# Patient Record
Sex: Male | Born: 1935 | Race: White | Hispanic: No | Marital: Married | Smoking: Never smoker
Health system: Southern US, Community
[De-identification: ages and names within clinical notes are randomized; demographics above are authoritative.]

## PROBLEM LIST (undated history)

## (undated) DIAGNOSIS — I219 Acute myocardial infarction, unspecified: Secondary | ICD-10-CM

## (undated) DIAGNOSIS — H3530 Unspecified macular degeneration: Secondary | ICD-10-CM

## (undated) DIAGNOSIS — H409 Unspecified glaucoma: Secondary | ICD-10-CM

## (undated) DIAGNOSIS — I10 Essential (primary) hypertension: Secondary | ICD-10-CM

## (undated) DIAGNOSIS — N289 Disorder of kidney and ureter, unspecified: Secondary | ICD-10-CM

## (undated) DIAGNOSIS — E119 Type 2 diabetes mellitus without complications: Secondary | ICD-10-CM

## (undated) DIAGNOSIS — C801 Malignant (primary) neoplasm, unspecified: Secondary | ICD-10-CM

## (undated) HISTORY — PX: CARDIAC SURGERY: SHX584

## (undated) HISTORY — PX: EYE SURGERY: SHX253

## (undated) HISTORY — PX: BACK SURGERY: SHX140

## (undated) HISTORY — PX: PROSTATE SURGERY: SHX751

## (undated) HISTORY — PX: HERNIA REPAIR: SHX51

---

## 2012-01-09 DIAGNOSIS — I2510 Atherosclerotic heart disease of native coronary artery without angina pectoris: Secondary | ICD-10-CM

## 2012-02-22 ENCOUNTER — Ambulatory Visit (INDEPENDENT_AMBULATORY_CARE_PROVIDER_SITE_OTHER): Payer: Self-pay | Admitting: Thoracic Surgery (Cardiothoracic Vascular Surgery)

## 2012-02-22 DIAGNOSIS — Z951 Presence of aortocoronary bypass graft: Secondary | ICD-10-CM

## 2012-02-22 DIAGNOSIS — I2510 Atherosclerotic heart disease of native coronary artery without angina pectoris: Secondary | ICD-10-CM

## 2012-02-22 NOTE — Progress Notes (Unsigned)
The patient is doing well. His sternum is stable. Wounds are well healed. He is walking for exercise. He has seen his cardiologist and they are pleased with his progress. Sternal precautions were reviewed. He will continue follow up with cardiology ad see [place] prn.

## 2014-08-17 DIAGNOSIS — J301 Allergic rhinitis due to pollen: Secondary | ICD-10-CM | POA: Insufficient documentation

## 2014-08-17 DIAGNOSIS — C61 Malignant neoplasm of prostate: Secondary | ICD-10-CM | POA: Insufficient documentation

## 2014-08-18 DIAGNOSIS — N184 Chronic kidney disease, stage 4 (severe): Secondary | ICD-10-CM | POA: Insufficient documentation

## 2014-08-18 DIAGNOSIS — N529 Male erectile dysfunction, unspecified: Secondary | ICD-10-CM | POA: Insufficient documentation

## 2014-08-18 DIAGNOSIS — E7800 Pure hypercholesterolemia, unspecified: Secondary | ICD-10-CM | POA: Insufficient documentation

## 2014-08-18 DIAGNOSIS — I701 Atherosclerosis of renal artery: Secondary | ICD-10-CM | POA: Insufficient documentation

## 2014-08-18 DIAGNOSIS — I10 Essential (primary) hypertension: Secondary | ICD-10-CM | POA: Insufficient documentation

## 2015-06-30 DIAGNOSIS — Z951 Presence of aortocoronary bypass graft: Secondary | ICD-10-CM | POA: Insufficient documentation

## 2015-12-08 DIAGNOSIS — E1129 Type 2 diabetes mellitus with other diabetic kidney complication: Secondary | ICD-10-CM | POA: Insufficient documentation

## 2016-12-27 DIAGNOSIS — I779 Disorder of arteries and arterioles, unspecified: Secondary | ICD-10-CM | POA: Insufficient documentation

## 2016-12-27 DIAGNOSIS — I739 Peripheral vascular disease, unspecified: Secondary | ICD-10-CM

## 2018-02-10 DIAGNOSIS — E039 Hypothyroidism, unspecified: Secondary | ICD-10-CM | POA: Insufficient documentation

## 2018-08-24 DIAGNOSIS — Z7982 Long term (current) use of aspirin: Secondary | ICD-10-CM | POA: Insufficient documentation

## 2018-10-03 DIAGNOSIS — D638 Anemia in other chronic diseases classified elsewhere: Secondary | ICD-10-CM | POA: Insufficient documentation

## 2019-02-05 ENCOUNTER — Emergency Department (HOSPITAL_BASED_OUTPATIENT_CLINIC_OR_DEPARTMENT_OTHER): Payer: Medicare HMO

## 2019-02-05 ENCOUNTER — Emergency Department (HOSPITAL_BASED_OUTPATIENT_CLINIC_OR_DEPARTMENT_OTHER)
Admission: EM | Admit: 2019-02-05 | Discharge: 2019-02-05 | Disposition: A | Discharge status: Home / Self Care | Payer: Medicare HMO | Attending: Emergency Medicine | Admitting: Emergency Medicine

## 2019-02-05 ENCOUNTER — Encounter (HOSPITAL_BASED_OUTPATIENT_CLINIC_OR_DEPARTMENT_OTHER): Payer: Self-pay | Admitting: *Deleted

## 2019-02-05 ENCOUNTER — Other Ambulatory Visit: Payer: Self-pay

## 2019-02-05 DIAGNOSIS — W1839XA Other fall on same level, initial encounter: Secondary | ICD-10-CM | POA: Diagnosis not present

## 2019-02-05 DIAGNOSIS — Y9389 Activity, other specified: Secondary | ICD-10-CM | POA: Insufficient documentation

## 2019-02-05 DIAGNOSIS — M545 Low back pain: Secondary | ICD-10-CM | POA: Diagnosis not present

## 2019-02-05 DIAGNOSIS — W19XXXA Unspecified fall, initial encounter: Secondary | ICD-10-CM

## 2019-02-05 DIAGNOSIS — I252 Old myocardial infarction: Secondary | ICD-10-CM | POA: Diagnosis not present

## 2019-02-05 DIAGNOSIS — Y999 Unspecified external cause status: Secondary | ICD-10-CM | POA: Diagnosis not present

## 2019-02-05 DIAGNOSIS — I2510 Atherosclerotic heart disease of native coronary artery without angina pectoris: Secondary | ICD-10-CM | POA: Insufficient documentation

## 2019-02-05 DIAGNOSIS — Y929 Unspecified place or not applicable: Secondary | ICD-10-CM | POA: Diagnosis not present

## 2019-02-05 DIAGNOSIS — E119 Type 2 diabetes mellitus without complications: Secondary | ICD-10-CM | POA: Insufficient documentation

## 2019-02-05 DIAGNOSIS — S50311A Abrasion of right elbow, initial encounter: Secondary | ICD-10-CM | POA: Insufficient documentation

## 2019-02-05 DIAGNOSIS — S4991XA Unspecified injury of right shoulder and upper arm, initial encounter: Secondary | ICD-10-CM | POA: Diagnosis present

## 2019-02-05 DIAGNOSIS — R51 Headache: Secondary | ICD-10-CM | POA: Insufficient documentation

## 2019-02-05 DIAGNOSIS — S42017A Nondisplaced fracture of sternal end of right clavicle, initial encounter for closed fracture: Secondary | ICD-10-CM | POA: Diagnosis not present

## 2019-02-05 DIAGNOSIS — M25551 Pain in right hip: Secondary | ICD-10-CM | POA: Diagnosis not present

## 2019-02-05 DIAGNOSIS — E039 Hypothyroidism, unspecified: Secondary | ICD-10-CM | POA: Diagnosis not present

## 2019-02-05 DIAGNOSIS — I10 Essential (primary) hypertension: Secondary | ICD-10-CM | POA: Diagnosis not present

## 2019-02-05 HISTORY — DX: Type 2 diabetes mellitus without complications: E11.9

## 2019-02-05 HISTORY — DX: Essential (primary) hypertension: I10

## 2019-02-05 HISTORY — DX: Unspecified macular degeneration: H35.30

## 2019-02-05 HISTORY — DX: Malignant (primary) neoplasm, unspecified: C80.1

## 2019-02-05 HISTORY — DX: Unspecified glaucoma: H40.9

## 2019-02-05 HISTORY — DX: Acute myocardial infarction, unspecified: I21.9

## 2019-02-05 HISTORY — DX: Disorder of kidney and ureter, unspecified: N28.9

## 2019-02-05 MED ORDER — HYDROCODONE-ACETAMINOPHEN 5-325 MG PO TABS: 1.0000 | ORAL_TABLET | Freq: Four times a day (QID) | ORAL | 0 refills | Status: AC | PRN

## 2019-02-05 NOTE — ED Triage Notes (Signed)
Pt fell yesterday on a handicap ramp, Pt hit his head and right side of body. Neck, shoulder, and also back

## 2019-02-05 NOTE — ED Notes (Signed)
NAD at this time. Pt is stable and going home.  

## 2019-02-05 NOTE — Discharge Instructions (Addendum)
Please read and follow all provided instructions.  You have been seen today for a fall Your imaging showed that you have a fracture to your right clavicle- we have placed you in a sling for this, please keep this on at all times until you have followed up with sports medicine- please see Dr. Bonnee Quin information in your discharge instructions- call for an appointment within the next 3 days. Your imaging also showed some degenerative changes in your neck, but no fractures.    Home care instructions: -- *PRICE in the first 24-48 hours after injury: Protect (with brace, splint, sling), if given by your provider Rest Ice- Do not apply ice pack directly to your skin, place towel or similar between your skin and ice/ice pack. Apply ice for 20 min, then remove for 40 min while awake Compression- Wear brace, elastic bandage, splint as directed by your provider Elevate affected extremity above the level of your heart when not walking around for the first 24-48 hours   Medications:  - Norco-this is a narcotic/controlled substance medication that has potential addicting qualities.  We recommend that you take 1 tablets every 6 hours as needed for severe pain.  Do not drive or operate heavy machinery when taking this medicine as it can be sedating. Do not drink alcohol or take other sedating medications when taking this medicine for safety reasons.  Keep this out of reach of small children.  Please be aware this medicine has Tylenol in it (325 mg/tab) do not exceed the maximum dose of Tylenol in a day per over the counter recommendations should you decide to supplement with Tylenol over the counter.   We also recommend using lidocaine patches (over the counter patch to place on the area that is most painful) per over the counter dosing  We have prescribed you new medication(s) today. Discuss the medications prescribed today with your pharmacist as they can have adverse effects and interactions with your other  medicines including over the counter and prescribed medications. Seek medical evaluation if you start to experience new or abnormal symptoms after taking one of these medicines, seek care immediately if you start to experience difficulty breathing, feeling of your throat closing, facial swelling, or rash as these could be indications of a more serious allergic reaction   Follow-up instructions: Please follow-up with Sports medicines within 3 days.   Return instructions:  Please return if your digits or extremity are numb or tingling, appear gray or blue, or you have severe pain (also elevate the extremity and loosen splint or wrap if you were given one) Please return if you have redness or fevers.  Please return to the Emergency Department if you experience worsening symptoms.  Please return if you have any other emergent concerns. Additional Information:  Your vital signs today were: BP (!) 176/71 (BP Location: Left Arm)    Pulse 64    Temp 98 F (36.7 C) (Oral)    Resp 18    Ht 5\' 8"  (1.727 m)    Wt 64 kg    SpO2 97%    BMI 21.44 kg/m  If your blood pressure (BP) was elevated above 135/85 this visit, please have this repeated by your doctor within one month. ---------------

## 2019-02-05 NOTE — ED Provider Notes (Signed)
[facility] EMERGENCY DEPARTMENT Provider Note   CSN: [id number] Arrival date & time: 02/05/19  3557    History   Chief Complaint Chief Complaint  Patient presents with  . Fall    HPI Douglas Cain is a 85 y.o. male with a hx of HTN, DM, CAD, hypercholesterolemia, and hypothyroidism who presents to the ED s/p mechanical fall yesterday with complaints of R shoulder/neck pain. Patient states he was ambulating up a handicap ramp, miss-stepped, and fell forward onto this R shoulder. He denies prodromal dizziness, lightheadedness, chest pain, or dyspnea. He states he fell onto the anterior R shoulder & did strike his head on the house siding, no LOC. He was able to get up on his own and has been ambulatory since. He relays primary area of pain is the R shoulder radiating into the R neck. He states pain is constant, worse with movement. He is also having some discomfort to the R side of the head & R lower back/hip area. States he scraped the R elbow, last tetanus was in October 2019. Denies change in vision, numbness, weakness, dizziness, nausea, vomiting, hemoptysis, hematuria, or hematochezia/melena. Patient is on plavix, no other blood thinners.      HPI  Past Medical History:  Diagnosis Date  . Cancer ([place])   . Diabetes mellitus without complication ([place])   . Glaucoma   . Hypertension   . Kidney disease   . Macular degeneration   . Myocardial infarct ([place])     There are no active problems to display for this patient.   Past Surgical History:  Procedure Laterality Date  . BACK SURGERY    . CARDIAC SURGERY    . EYE SURGERY    . HERNIA REPAIR    . PROSTATE SURGERY          Home Medications    Prior to Admission medications   Not on File    Family History History reviewed. No pertinent family history.  Social History Social History   Tobacco Use  . Smoking status: Never Smoker  . Smokeless tobacco: Never Used  Substance Use Topics  . Alcohol  use: Never    Frequency: Never  . Drug use: Never     Allergies   Tape   Review of Systems Review of Systems  Constitutional: Negative for chills and fever.  Eyes: Negative for visual disturbance.  Respiratory: Negative for shortness of breath.   Cardiovascular: Negative for chest pain.  Gastrointestinal: Negative for abdominal pain, nausea and vomiting.  Musculoskeletal: Positive for arthralgias, back pain and neck pain.  Skin: Positive for wound.  Neurological: Positive for headaches. Negative for dizziness, seizures, syncope, facial asymmetry, speech difficulty, weakness, light-headedness and numbness.  All other systems reviewed and are negative.    Physical Exam Updated Vital Signs BP (!) 176/71 (BP Location: Left Arm)   Pulse 64   Temp 98 F (36.7 C) (Oral)   Resp 18   Ht 5\' 8"  (1.727 m)   Wt 64 kg   SpO2 97%   BMI 21.44 kg/m   Physical Exam Vitals signs and nursing note reviewed.  Constitutional:      General: He is not in acute distress.    Appearance: He is well-developed. He is not toxic-appearing.  HENT:     Head: Normocephalic and atraumatic. No raccoon eyes or Battle's sign.     Right Ear: No hemotympanum.     Left Ear: No hemotympanum.     Nose: No rhinorrhea.  Mouth/Throat:     Pharynx: Uvula midline.  Eyes:     General:        Right eye: No discharge.        Left eye: No discharge.     Extraocular Movements: Extraocular movements intact.     Conjunctiva/sclera: Conjunctivae normal.     Comments: PERRL   Neck:     Musculoskeletal: Neck supple. Pain with movement and muscular tenderness (R) present. No edema, erythema, neck rigidity, crepitus or spinous process tenderness.  Cardiovascular:     Rate and Rhythm: Normal rate and regular rhythm.     Comments: 2+ symmetric radial pulses Pulmonary:     Effort: Pulmonary effort is normal. No respiratory distress.     Breath sounds: Normal breath sounds. No wheezing, rhonchi or rales.  Chest:      Chest wall: Tenderness (right anterior chest wall just below the clavicle) present. No mass, lacerations, deformity, swelling, crepitus or edema.     Comments: No bruising to chest/abdomen.  Abdominal:     General: There is no distension.     Palpations: Abdomen is soft.     Tenderness: There is no abdominal tenderness.  Musculoskeletal:     Comments: Upper extremities: Superficial abrasion noted to R posterior elbow- no active bleeding, no surrounding erythema, no purulence. Intact AROM to L shoulder, bilateral elbows, wrists, & all digits. R shoulder flexion/abduction limited secondary to pain- able to move to about 60 degrees with these movements. Patient is tender over the R trapezius, SITS muscles, pec major, and diffuse glenohumeral joint. UEs are otherwise nontender Back: No midline tenderness to the cervical, thoracic, or lumbar spine. No palpable step off. Some R lumbar paraspinal muscle tenderness.  Lower extremities: Intact AROM to bilateral hips, knees, & ankles. Tender over the R ischium mildly. LEs are otherwise nontender.   Skin:    General: Skin is warm and dry.     Capillary Refill: Capillary refill takes less than 2 seconds.     Findings: No rash.  Neurological:     General: No focal deficit present.     Mental Status: He is alert.     Comments: Clear speech. CN III-XII grossly intact. Sensation grossly intact x 4. 5/5 symmetric grip strength. 5/5 strength with plantar/dorsiflexion bilaterally. Ambulatory without assistive device.   Psychiatric:        Behavior: Behavior normal.    ED Treatments / Results  Labs (all labs ordered are listed, but only abnormal results are displayed) Labs Reviewed - No data to display  EKG None  Radiology Dg Ribs Unilateral W/chest Right  Result Date: 02/05/2019 CLINICAL DATA:  Pain after fall EXAM: RIGHT RIBS AND CHEST - 3+ VIEW COMPARISON:  October 02, 2018 FINDINGS: No fracture or other bone lesions are seen involving the ribs.  There is no evidence of pneumothorax or pleural effusion. Both lungs are clear. Heart size and mediastinal contours are within normal limits. IMPRESSION: Negative. Electronically Signed   By: Dorise Bullion III M.D   On: 02/05/2019 11:17   Dg Shoulder Right  Result Date: 02/05/2019 CLINICAL DATA:  Pain after fall yesterday. EXAM: RIGHT SHOULDER - 2+ VIEW COMPARISON:  None. FINDINGS: No fracture or dislocation identified. The transscapular Y-view is markedly limited however. IMPRESSION: The study is limited due to positioning on the transscapular Y-view. However, there is no evidence of fracture dislocation on this study. Electronically Signed   By: Dorise Bullion III M.D   On: 02/05/2019 11:14   Ct  Head Wo Contrast  Result Date: 02/05/2019 CLINICAL DATA:  85 year old male fell yesterday on handicap ramp. Hit right-side of head and body. Initial encounter. EXAM: CT HEAD WITHOUT CONTRAST CT CERVICAL SPINE WITHOUT CONTRAST TECHNIQUE: Multidetector CT imaging of the head and cervical spine was performed following the standard protocol without intravenous contrast. Multiplanar CT image reconstructions of the cervical spine were also generated. COMPARISON:  03/23/2018 head CT. FINDINGS: CT HEAD FINDINGS Brain: No intracranial hemorrhage or CT evidence of large acute infarct. Chronic microvascular changes. Global atrophy. No intracranial mass lesion noted on this unenhanced exam. Vascular: Vascular calcifications.  No acute hyperdense vessel. Skull: No skull fracture. Sinuses/Orbits: Post lens replacement. No acute orbital abnormality. Opacification left frontal sinus and ethmoid sinus air cells bilaterally. Minimal mucosal thickening right sphenoid sinus. Other: Mastoid air cells and middle ear cavities are clear. CT CERVICAL SPINE FINDINGS Alignment: Slight curvature.  Minimal anterior slip C3, C4 and C5. Skull base and vertebrae: No cervical spine fracture. Soft tissues and spinal canal: No abnormal  prevertebral soft tissue swelling. Disc levels: Multilevel cervical spondylotic changes with various degrees of spinal stenosis and slight cord flattening most notable C4-5 through C6-7. Upper chest: Fracture medial aspect right clavicle. Other: Carotid bifurcation calcifications. IMPRESSION: 1. No skull fracture or intracranial hemorrhage. 2. Chronic microvascular changes. 3. Global atrophy. 4. No cervical spine fracture or abnormal prevertebral soft tissue swelling. 5. 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 6. Fracture medial aspect right clavicle/clavicle head. Electronically Signed   By: Genia Del M.D.   On: 02/05/2019 11:16   Ct Cervical Spine Wo Contrast  Result Date: 02/05/2019 CLINICAL DATA:  85 year old male fell yesterday on handicap ramp. Hit right-side of head and body. Initial encounter. EXAM: CT HEAD WITHOUT CONTRAST CT CERVICAL SPINE WITHOUT CONTRAST TECHNIQUE: Multidetector CT imaging of the head and cervical spine was performed following the standard protocol without intravenous contrast. Multiplanar CT image reconstructions of the cervical spine were also generated. COMPARISON:  03/23/2018 head CT. FINDINGS: CT HEAD FINDINGS Brain: No intracranial hemorrhage or CT evidence of large acute infarct. Chronic microvascular changes. Global atrophy. No intracranial mass lesion noted on this unenhanced exam. Vascular: Vascular calcifications.  No acute hyperdense vessel. Skull: No skull fracture. Sinuses/Orbits: Post lens replacement. No acute orbital abnormality. Opacification left frontal sinus and ethmoid sinus air cells bilaterally. Minimal mucosal thickening right sphenoid sinus. Other: Mastoid air cells and middle ear cavities are clear. CT CERVICAL SPINE FINDINGS Alignment: Slight curvature.  Minimal anterior slip C3, C4 and C5. Skull base and vertebrae: No cervical spine fracture. Soft  tissues and spinal canal: No abnormal prevertebral soft tissue swelling. Disc levels: Multilevel cervical spondylotic changes with various degrees of spinal stenosis and slight cord flattening most notable C4-5 through C6-7. Upper chest: Fracture medial aspect right clavicle. Other: Carotid bifurcation calcifications. IMPRESSION: 1. No skull fracture or intracranial hemorrhage. 2. Chronic microvascular changes. 3. Global atrophy. 4. No cervical spine fracture or abnormal prevertebral soft tissue swelling. 5. 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 6. Fracture medial aspect right clavicle/clavicle head. Electronically Signed   By: Genia Del M.D.   On: 02/05/2019 11:16   Dg Hip Unilat With Pelvis 2-3 Views Right  Result Date: 02/05/2019 CLINICAL DATA:  Pain after fall EXAM: DG HIP (WITH OR WITHOUT PELVIS) 2-3V RIGHT COMPARISON:  None. FINDINGS: There is no evidence of  hip fracture or dislocation. There is no evidence of arthropathy or other focal bone abnormality. IMPRESSION: Negative. Electronically Signed   By: Dorise Bullion III M.D   On: 02/05/2019 11:17   Procedures Procedures (including critical care time)  Medications Ordered in ED Medications - No data to display   Initial Impression / Assessment and Plan / ED Course  I have reviewed the triage vital signs and the nursing notes.  Pertinent labs & imaging results that were available during my care of the patient were reviewed by me and considered in my medical decision making (see chart for details).   Patient presents to the ED s/p mechanical fall yesterday afternoon, + head injury on plavix, no LOC. Nontoxic appearing, vitals w/ elevated BP otherwise WNL, suspicion for HTN emergency is low. No prodromal sxs. Imaging obtained in concordance with areas of injury noted.   Imaging reviewed: No intracranial hemorrhage, skull fx, or acute  cervical spine fx. Degenerative changes noted throughout C spine- no neuro deficits. Plain films negative. Patient does have R medial clavicular fracture noted on CT cervical spine. NVI distally.   11:42: CONSULT: Discussed medial clavicular fx with radiologist Dr. Irish Elders- specifically if there is any displacement or concern for posterior displacement warranting CT scan for further assessment of vessel injury, he has rechecked imaging, no displacement noted.   Will place patient in sling immobilizer. A short course of Vicodin for severe pain, otherwise tylenol and OTC lidoderm patches recommended. Sports medicine follow up. I discussed results, treatment plan, need for follow-up, and return precautions with the patient. Provided opportunity for questions, patient confirmed understanding and is in agreement with plan.   Findings and plan of care discussed with supervising physician Dr. Rex Kras who personally evaluated and examined this patient & is in agreement.    Final Clinical Impressions(s) / ED Diagnoses   Final diagnoses:  Fall, initial encounter  Closed nondisplaced fracture of sternal end of right clavicle, initial encounter    ED Discharge Orders         Ordered    HYDROcodone-acetaminophen (NORCO/VICODIN) 5-325 MG tablet  Every 6 hours PRN     02/05/19 1215           Amaryllis Dyke, PA-C 02/05/19 1354    Little, Wenda Overland, MD 02/05/19 1540

## 2019-02-08 ENCOUNTER — Ambulatory Visit: Payer: Medicare Other | Admitting: Family Medicine

## 2019-02-11 ENCOUNTER — Ambulatory Visit: Payer: Medicare HMO | Admitting: Family Medicine

## 2019-02-11 ENCOUNTER — Encounter: Payer: Self-pay | Admitting: Family Medicine

## 2019-02-11 VITALS — BP 158/94 | HR 67 | Ht 68.0 in | Wt 141.0 lb

## 2019-02-11 DIAGNOSIS — S42017A Nondisplaced fracture of sternal end of right clavicle, initial encounter for closed fracture: Secondary | ICD-10-CM

## 2019-02-11 NOTE — Patient Instructions (Addendum)
Use the sling at most for 1 more week but stop using this if possible. Work on regaining your elbow extension, do arm circles and swings 3 sets of 10 once a day. Take the hydrocodone only if needed. Don't take hydrocodone with tylenol since it has tylenol in it. Consider aspercreme up to 4 times a day as needed. Icing 15 minutes at a time 3-4 times a day as needed. Follow up with me in 3 weeks.

## 2019-02-12 ENCOUNTER — Encounter: Payer: Self-pay | Admitting: Family Medicine

## 2019-02-12 NOTE — Progress Notes (Signed)
PCP: Charleston Poot, MD  Subjective:   HPI: Douglas Cain is a 85 y.o. male here for right shoulder injury.  Douglas Cain reports he was stepping up onto a ramp he made for his daughter who has CP when he tripped and fell onto right shoulder, striking head onto siding of the house. Went to ED and had CTs of head and cervical spine, radiographs of right hip, chest with right sided ribs, right shoulder.  Noted to have a fracture medial right clavicle  Overall he is doing well - has 0/10 pain at rest in right shoulder/clavicle. Improved with wearing sling and swathe. Took only two tabs of norco since accident and this helped. He's having some paraspinal low back pain as well but no midline pain - states this didn't start until 2-3 days after the fall. No bowel/bladder dysfunction. No radiation into legs.  Past Medical History:  Diagnosis Date  . Cancer ([place])   . Diabetes mellitus without complication ([place])   . Glaucoma   . Hypertension   . Kidney disease   . Macular degeneration   . Myocardial infarct [facility])     Current Outpatient Medications on File Prior to Visit  Medication Sig Dispense Refill  . amLODipine (NORVASC) 10 MG tablet     . aspirin EC 81 MG tablet Take by mouth.    Marland Kitchen atorvastatin (LIPITOR) 20 MG tablet     . clopidogrel (PLAVIX) 75 MG tablet Take 75 mg by mouth daily.    . hydrALAZINE (APRESOLINE) 50 MG tablet     . HYDROcodone-acetaminophen (NORCO/VICODIN) 5-325 MG tablet Take 1 tablet by mouth every 6 (six) hours as needed. 5 tablet 0  . latanoprost (XALATAN) 0.005 % ophthalmic solution     . levothyroxine (SYNTHROID, LEVOTHROID) 50 MCG tablet     . travoprost, benzalkonium, (TRAVATAN) 0.004 % ophthalmic solution 1 drop at bedtime.     No current facility-administered medications on file prior to visit.     Past Surgical History:  Procedure Laterality Date  . BACK SURGERY    . CARDIAC SURGERY    . EYE SURGERY    . HERNIA REPAIR    . PROSTATE SURGERY       Allergies  Allergen Reactions  . Tape     Social History   Socioeconomic History  . Marital status: Married    Spouse name: Not on file  . Number of children: Not on file  . Years of education: Not on file  . Highest education level: Not on file  Occupational History  . Not on file  Social Needs  . Financial resource strain: Not on file  . Food insecurity:    Worry: Not on file    Inability: Not on file  . Transportation needs:    Medical: Not on file    Non-medical: Not on file  Tobacco Use  . Smoking status: Never Smoker  . Smokeless tobacco: Never Used  Substance and Sexual Activity  . Alcohol use: Never    Frequency: Never  . Drug use: Never  . Sexual activity: Not on file  Lifestyle  . Physical activity:    Days per week: Not on file    Minutes per session: Not on file  . Stress: Not on file  Relationships  . Social connections:    Talks on phone: Not on file    Gets together: Not on file    Attends religious service: Not on file    Active member of club or organization:  Not on file    Attends meetings of clubs or organizations: Not on file    Relationship status: Not on file  . Intimate partner violence:    Fear of current or ex partner: Not on file    Emotionally abused: Not on file    Physically abused: Not on file    Forced sexual activity: Not on file  Other Topics Concern  . Not on file  Social History Narrative  . Not on file    History reviewed. No pertinent family history.  BP (!) 158/94   Pulse 67   Ht 5\' 8"  (1.727 m)   Wt 141 lb (64 kg)   BMI 21.44 kg/m   Review of Systems: See HPI above.     Objective:  Physical Exam:  Gen: NAD, comfortable in exam room  Right shoulder: Localized swelling over medial clavicle with bruising.  No other deformity. TTP medial clavicle, at [facility] joint. ROM limited to 50 degrees ER, 90 flexion and abduction, full IR. Negative Hawkins, Neers. Negative Yergasons. Strength 5/5 with empty can and  resisted internal/external rotation.  Pain over medial clavicle with motion. Negative apprehension. NV intact distally.  Left shoulder: No swelling, ecchymoses.  No gross deformity. No TTP. FROM. Strength 5/5 with empty can and resisted internal/external rotation. NV intact distally.  Low back: No midline tenderness.   Assessment & Plan:  1. Right medial clavicle fracture - independently reviewed radiographs and cervical spine CT - noted small fracture of medial clavicular head, nondisplaced.  Clinically doing well.  Use sling at most for 1 more week, work on motion exercises.  Hydrocodone, aspercreme, icing if needed.  F/u in 3 weeks.  Consider PT in future.

## 2019-03-04 ENCOUNTER — Other Ambulatory Visit: Payer: Self-pay

## 2019-03-04 ENCOUNTER — Encounter: Payer: Self-pay | Admitting: Family Medicine

## 2019-03-04 ENCOUNTER — Ambulatory Visit: Payer: Medicare HMO | Admitting: Family Medicine

## 2019-03-04 VITALS — BP 160/68 | HR 55 | Ht 68.0 in | Wt 144.0 lb

## 2019-03-04 DIAGNOSIS — R296 Repeated falls: Secondary | ICD-10-CM | POA: Diagnosis not present

## 2019-03-04 DIAGNOSIS — S42017D Nondisplaced fracture of sternal end of right clavicle, subsequent encounter for fracture with routine healing: Secondary | ICD-10-CM | POA: Diagnosis not present

## 2019-03-04 DIAGNOSIS — Z9181 History of falling: Secondary | ICD-10-CM

## 2019-03-04 NOTE — Progress Notes (Signed)
PCP: Charleston Poot, MD  Subjective:   HPI: Patient is a 85 y.o. male here for right shoulder injury.  2/24: Patient reports he was stepping up onto a ramp he made for his daughter who has CP when he tripped and fell onto right shoulder, striking head onto siding of the house. Went to ED and had CTs of head and cervical spine, radiographs of right hip, chest with right sided ribs, right shoulder.  Noted to have a fracture medial right clavicle  Overall he is doing well - has 0/10 pain at rest in right shoulder/clavicle. Improved with wearing sling and swathe. Took only two tabs of norco since accident and this helped. He's having some paraspinal low back pain as well but no midline pain - states this didn't start until 2-3 days after the fall. No bowel/bladder dysfunction. No radiation into legs.  3/16: Patient for follow-up for right proximal clavicle fracture.  He reports 0/10 pain and is doing much better.  He notes having near full range of motion.  He denies any redness.  He has been doing the home exercises daily.  He is not requiring any pain medication.  He does report that 2 days ago he fell again this time onto the left side.  He denies any injuries from this event.  He feels this was caused by him tripping on his carpet and he was unable to stop the momentum of his fall.  Since then, he has been using a walker while in and out of the house.    Past Medical History:  Diagnosis Date  . Cancer ([place])   . Diabetes mellitus without complication ([place])   . Glaucoma   . Hypertension   . Kidney disease   . Macular degeneration   . Myocardial infarct [facility])     Current Outpatient Medications on File Prior to Visit  Medication Sig Dispense Refill  . amLODipine (NORVASC) 10 MG tablet     . aspirin EC 81 MG tablet Take by mouth.    Marland Kitchen atorvastatin (LIPITOR) 20 MG tablet     . clopidogrel (PLAVIX) 75 MG tablet Take 75 mg by mouth daily.    . hydrALAZINE (APRESOLINE) 50 MG tablet      . HYDROcodone-acetaminophen (NORCO/VICODIN) 5-325 MG tablet Take 1 tablet by mouth every 6 (six) hours as needed. 5 tablet 0  . latanoprost (XALATAN) 0.005 % ophthalmic solution     . levothyroxine (SYNTHROID, LEVOTHROID) 50 MCG tablet     . travoprost, benzalkonium, (TRAVATAN) 0.004 % ophthalmic solution 1 drop at bedtime.     No current facility-administered medications on file prior to visit.     Past Surgical History:  Procedure Laterality Date  . BACK SURGERY    . CARDIAC SURGERY    . EYE SURGERY    . HERNIA REPAIR    . PROSTATE SURGERY      Allergies  Allergen Reactions  . Tape     Social History   Socioeconomic History  . Marital status: Married    Spouse name: Not on file  . Number of children: Not on file  . Years of education: Not on file  . Highest education level: Not on file  Occupational History  . Not on file  Social Needs  . Financial resource strain: Not on file  . Food insecurity:    Worry: Not on file    Inability: Not on file  . Transportation needs:    Medical: Not on file  Non-medical: Not on file  Tobacco Use  . Smoking status: Never Smoker  . Smokeless tobacco: Never Used  Substance and Sexual Activity  . Alcohol use: Never    Frequency: Never  . Drug use: Never  . Sexual activity: Not on file  Lifestyle  . Physical activity:    Days per week: Not on file    Minutes per session: Not on file  . Stress: Not on file  Relationships  . Social connections:    Talks on phone: Not on file    Gets together: Not on file    Attends religious service: Not on file    Active member of club or organization: Not on file    Attends meetings of clubs or organizations: Not on file    Relationship status: Not on file  . Intimate partner violence:    Fear of current or ex partner: Not on file    Emotionally abused: Not on file    Physically abused: Not on file    Forced sexual activity: Not on file  Other Topics Concern  . Not on file  Social  History Narrative  . Not on file    No family history on file.  BP (!) 160/68   Pulse (!) 55   Ht 5\' 8"  (1.727 m)   Wt 144 lb (65.3 kg)   BMI 21.90 kg/m   Review of Systems: See HPI above.     Objective:  Physical Exam:  Gen: NAD, comfortable in exam room  Right shoulder: Swelling and bony hypertrophy of the right [place] joint compared to the left No tenderness to palpation Range of motion of the right shoulder limited in flexion and abduction Symmetric strength with rotator cuff testing N/V intact upper extremity  Left shoulder No obvious deformity No tenderness palpation Full range of motion with normal strength of the rotator cuff N/V intact distally  Assessment & Plan:  1. Right medial clavicle fracture - this is significantly improved. He will continue to work on ROM and strengthening for the shouldering  2. Falls-  He has had 4 falls in the past year. Will refer to PT to have them work on balance and strength issues to help prevent further falls.

## 2019-03-04 NOTE — Patient Instructions (Signed)
Start physical therapy for evaluation from the few falls in the past year. Do home exercises regularly going forward. Call me if you have any problems otherwise follow up as needed.

## 2019-03-05 ENCOUNTER — Encounter: Payer: Self-pay | Admitting: Family Medicine

## 2019-07-24 IMAGING — DX DG HIP (WITH OR WITHOUT PELVIS) 2-3V*R*
3 series · 3 of 3 positions shown · non-contrast
Comparison: None.

CLINICAL DATA: Pain after fall

EXAM:
DG HIP (WITH OR WITHOUT PELVIS) 2-3V RIGHT

[pelvis ap]
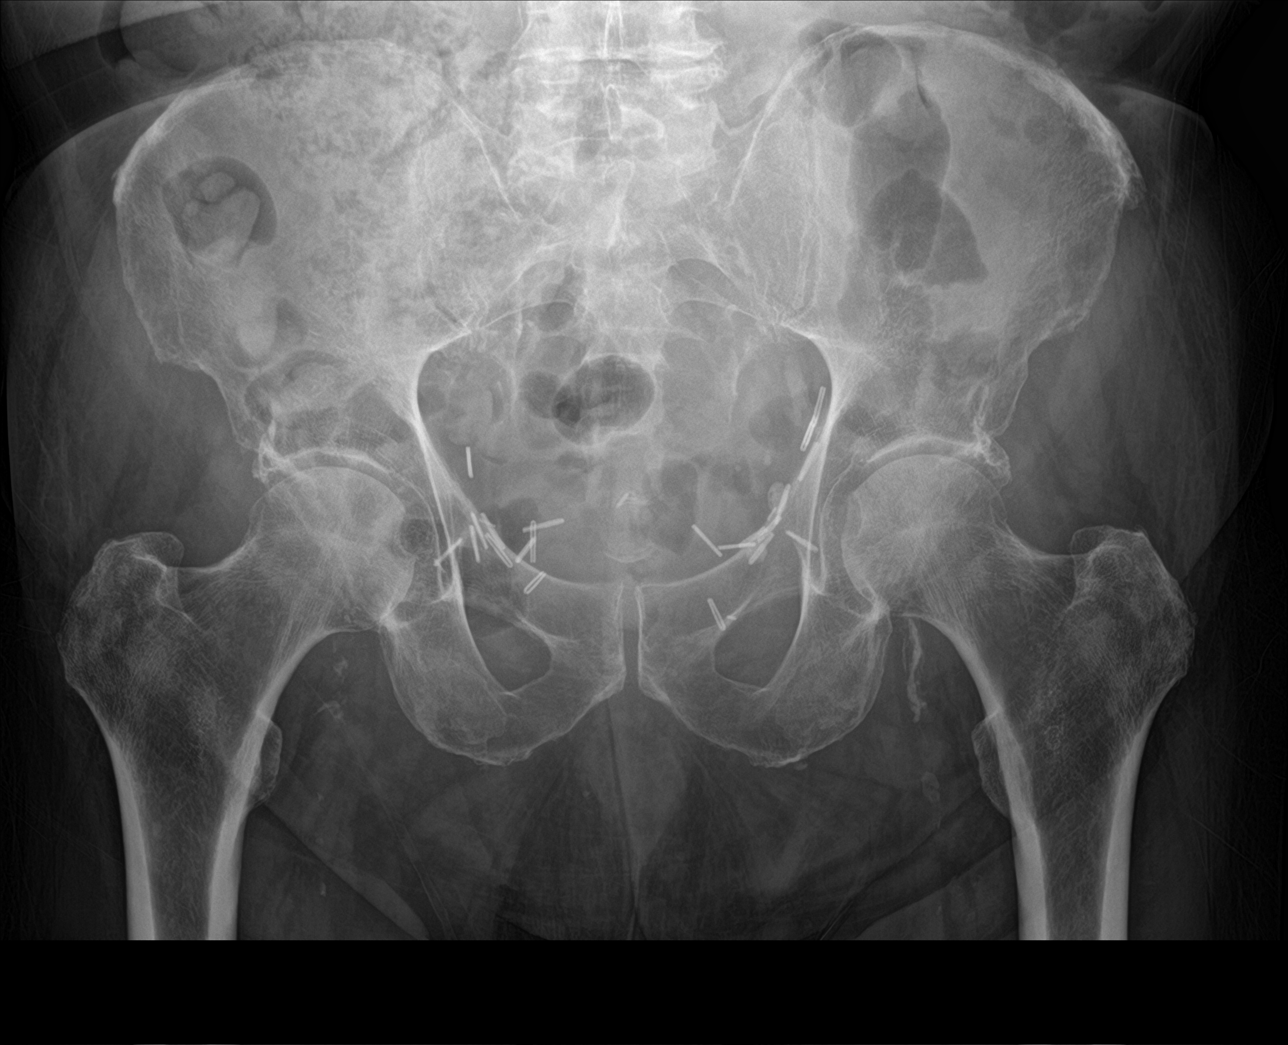

[hip ap]
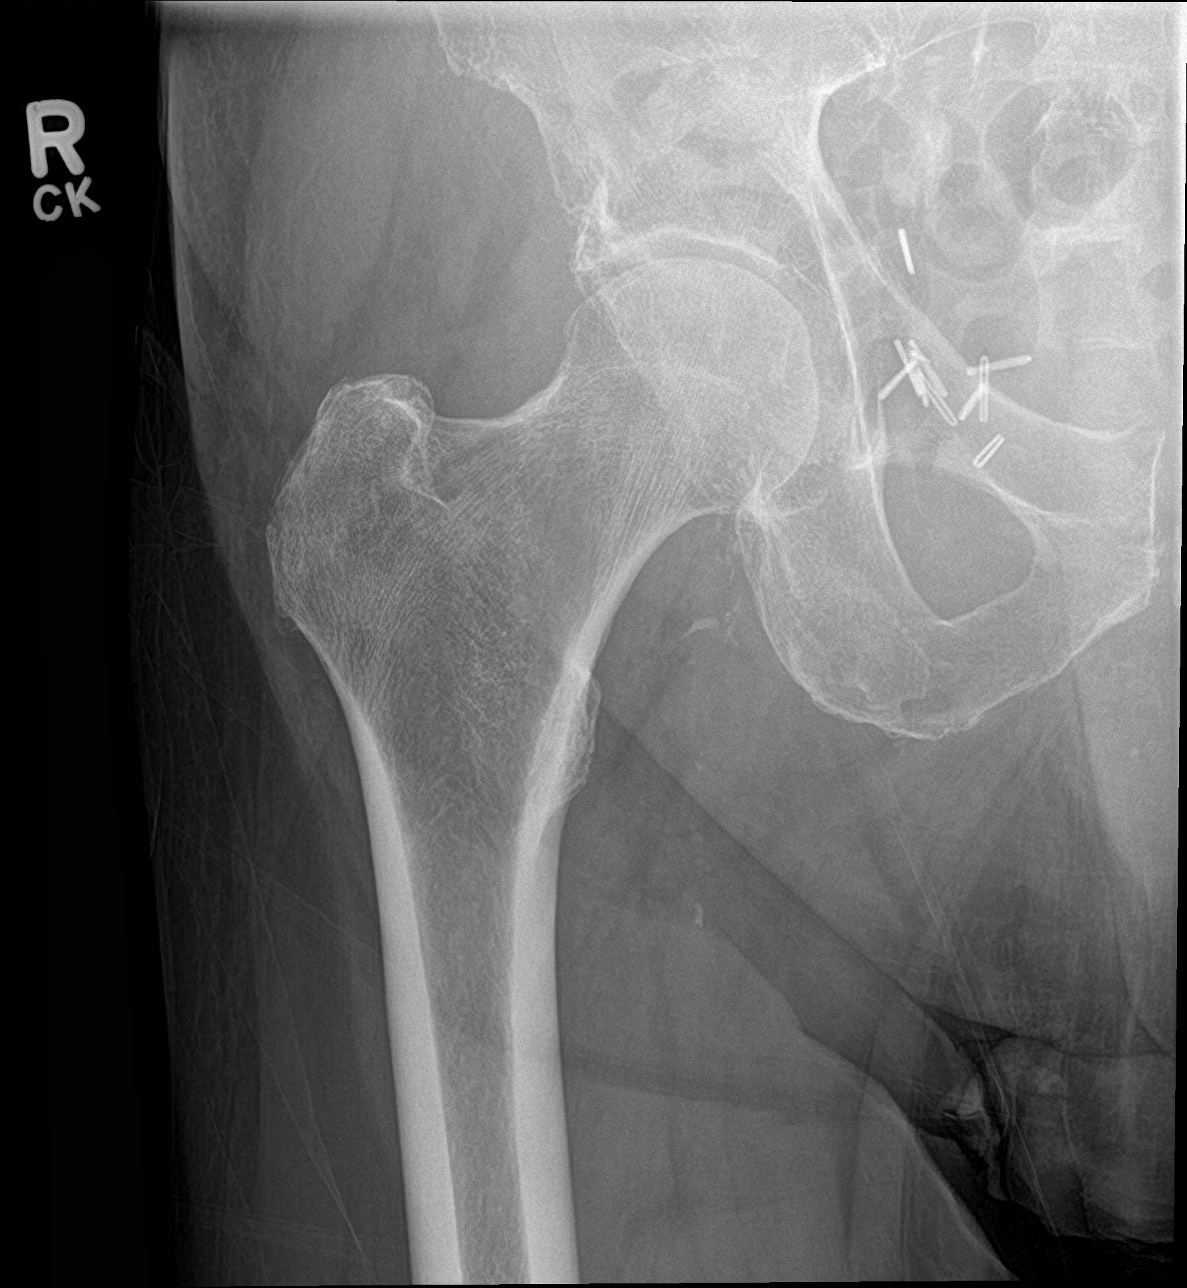

[hip lat]
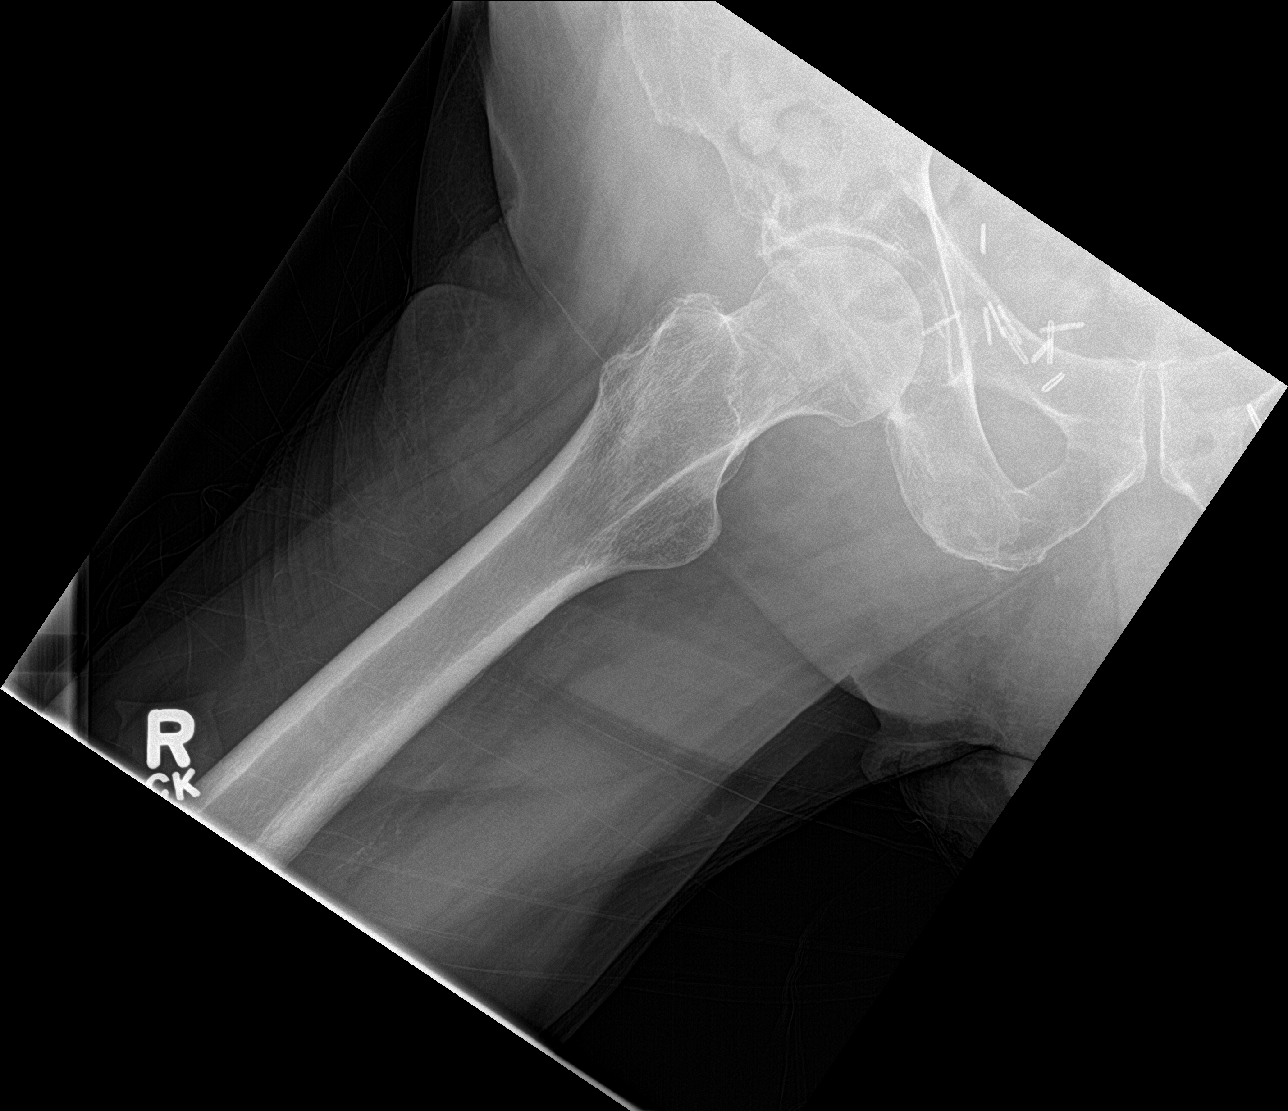

[3 of 3 positions shown; findings below may reference images not displayed]

FINDINGS: There is no evidence of hip fracture or dislocation. There is no
evidence of arthropathy or other focal bone abnormality.
IMPRESSION: Negative.

## 2019-07-24 IMAGING — DX DG RIBS W/ CHEST 3+V*R*
3 series · 3 of 3 positions shown · non-contrast
Comparison: October 02, 2018

CLINICAL DATA: Pain after fall

EXAM:
RIGHT RIBS AND CHEST - 3+ VIEW

[chest pa]
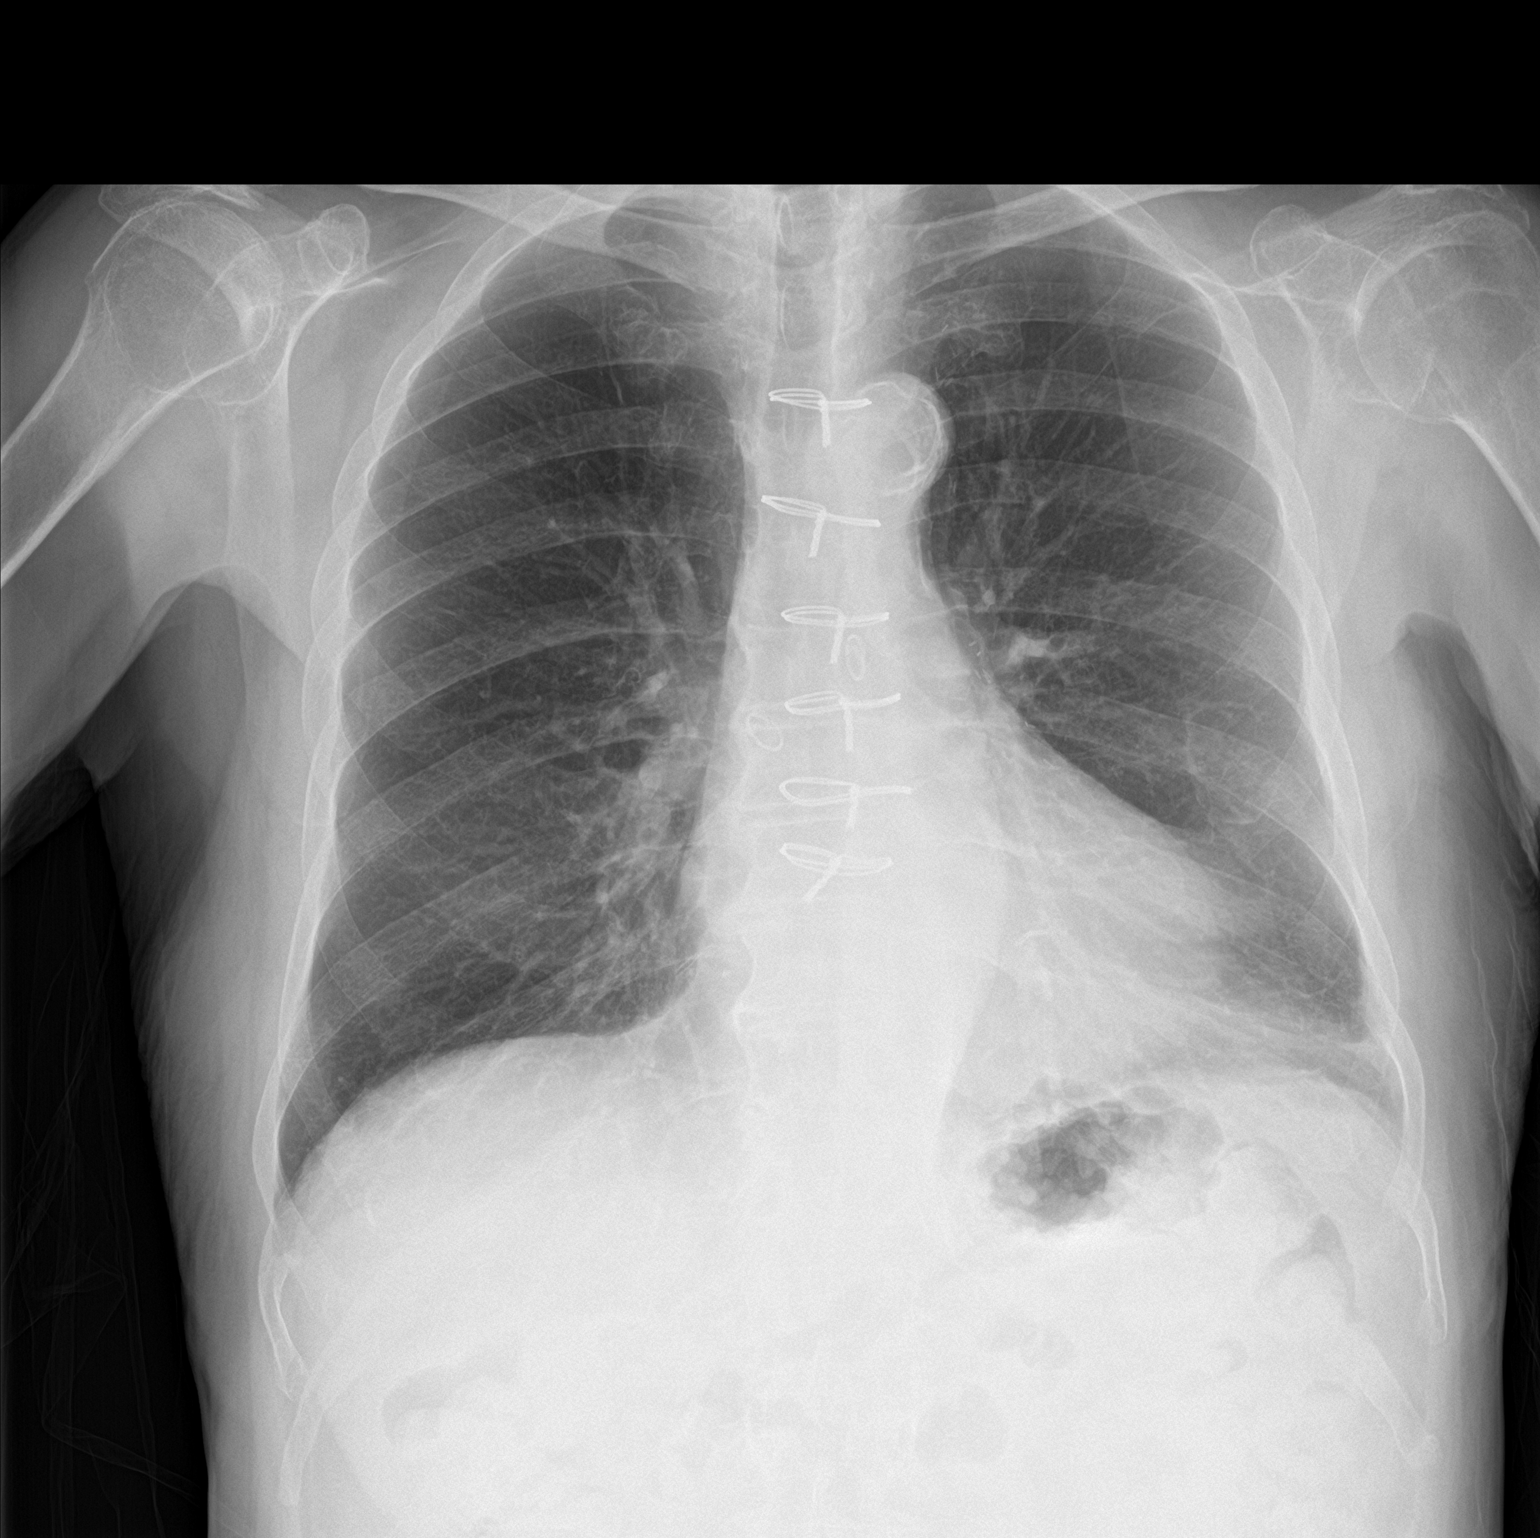

[rib pa]
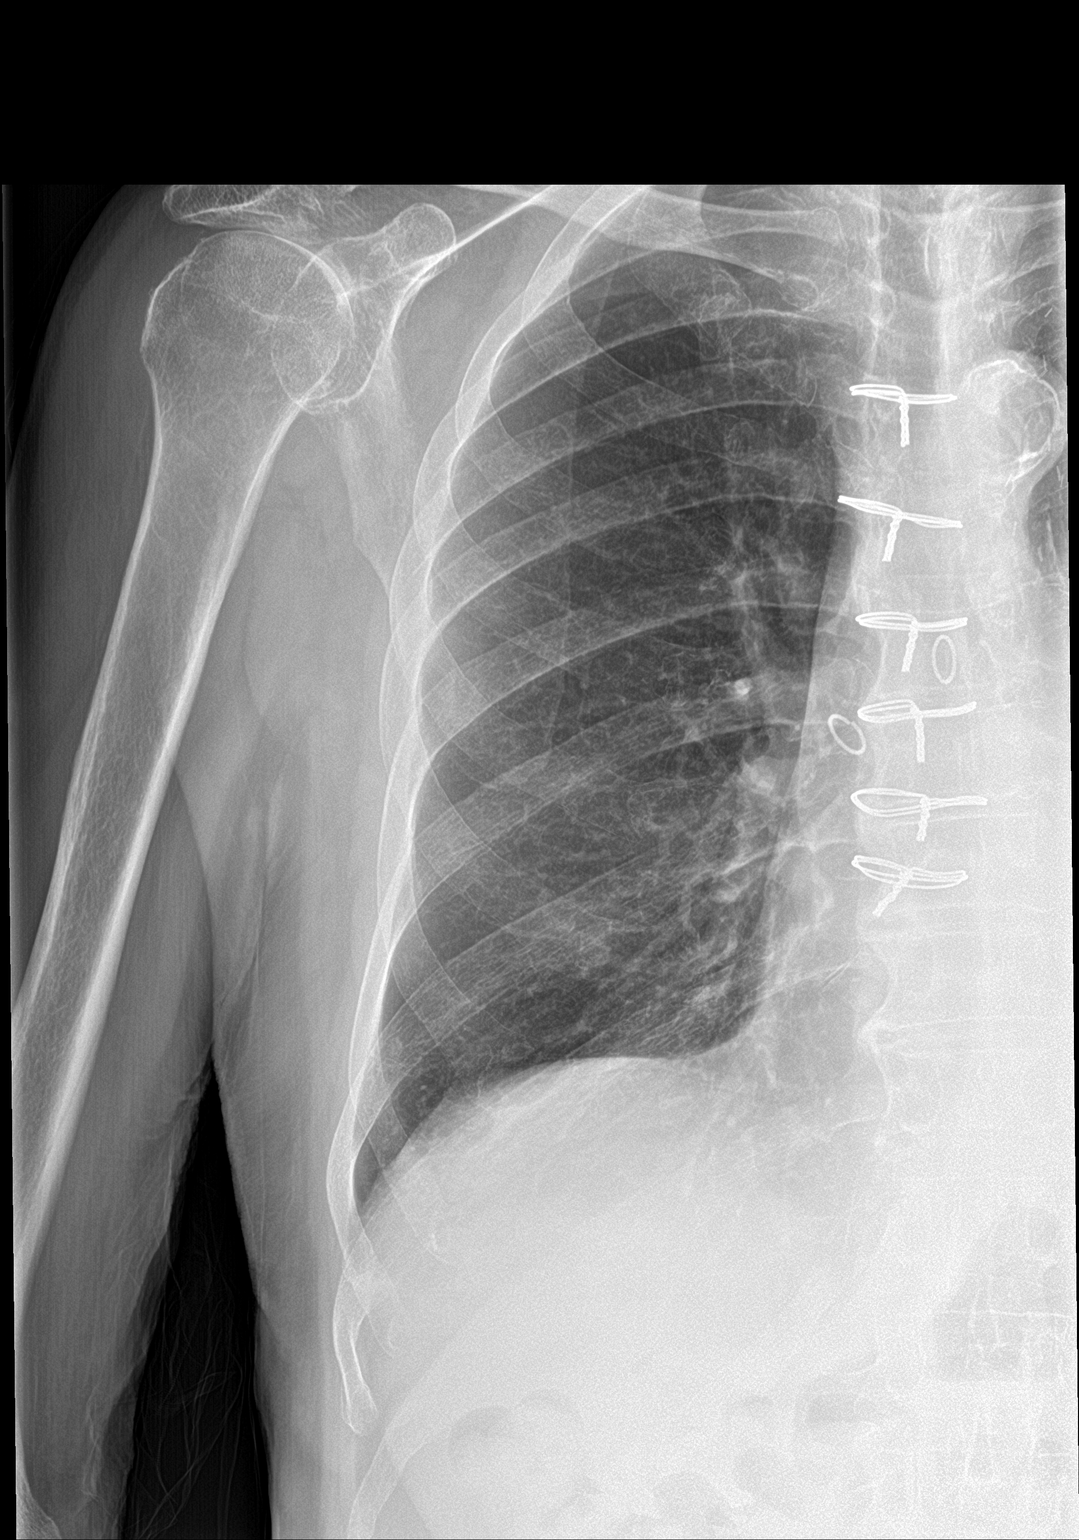

[rib pa obl]
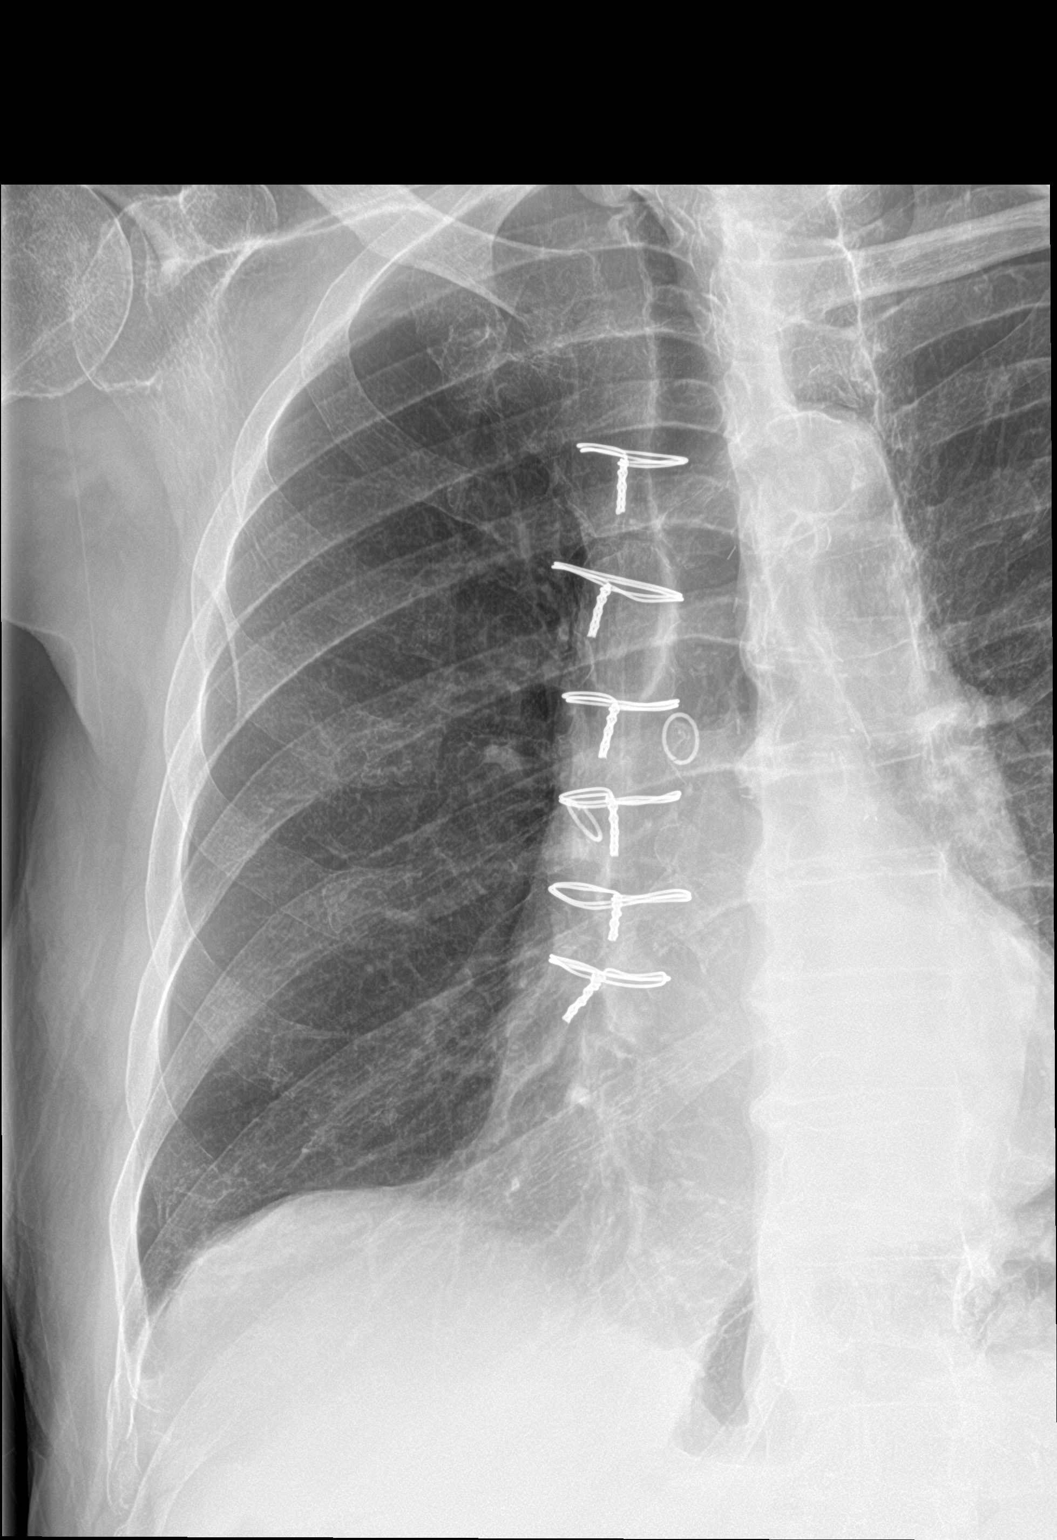

[3 of 3 positions shown; findings below may reference images not displayed]

FINDINGS: No fracture or other bone lesions are seen involving the ribs. There
is no evidence of pneumothorax or pleural effusion. Both lungs are
clear. Heart size and mediastinal contours are within normal limits.
IMPRESSION: Negative.

## 2020-01-27 ENCOUNTER — Ambulatory Visit: Payer: Medicare HMO

## 2022-04-18 DEATH — deceased
